# Patient Record
Sex: Female | Born: 2000 | Race: Black or African American | Hispanic: No | Marital: Single | Smoking: Former smoker
Health system: Southern US, Community
[De-identification: ages and names within clinical notes are randomized; demographics above are authoritative.]

## PROBLEM LIST (undated history)

## (undated) ENCOUNTER — Emergency Department (HOSPITAL_COMMUNITY): Admission: EM | Payer: Medicaid Other | Source: Home / Self Care

## (undated) ENCOUNTER — Inpatient Hospital Stay (HOSPITAL_COMMUNITY): Payer: Self-pay

## (undated) DIAGNOSIS — F419 Anxiety disorder, unspecified: Secondary | ICD-10-CM

## (undated) HISTORY — PX: OTHER SURGICAL HISTORY: SHX169

---
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
## 2021-05-02 ENCOUNTER — Emergency Department (HOSPITAL_COMMUNITY): Payer: Medicaid Other

## 2021-05-02 ENCOUNTER — Emergency Department (HOSPITAL_COMMUNITY)
Admission: EM | Admit: 2021-05-02 | Discharge: 2021-05-02 | Disposition: A | Discharge status: Home / Self Care | Payer: Medicaid Other | Attending: Emergency Medicine | Admitting: Emergency Medicine

## 2021-05-02 ENCOUNTER — Encounter (HOSPITAL_COMMUNITY): Payer: Self-pay

## 2021-05-02 DIAGNOSIS — O26891 Other specified pregnancy related conditions, first trimester: Secondary | ICD-10-CM | POA: Insufficient documentation

## 2021-05-02 DIAGNOSIS — R42 Dizziness and giddiness: Secondary | ICD-10-CM | POA: Insufficient documentation

## 2021-05-02 DIAGNOSIS — O4690 Antepartum hemorrhage, unspecified, unspecified trimester: Secondary | ICD-10-CM

## 2021-05-02 DIAGNOSIS — N9489 Other specified conditions associated with female genital organs and menstrual cycle: Secondary | ICD-10-CM | POA: Diagnosis not present

## 2021-05-02 DIAGNOSIS — Z3490 Encounter for supervision of normal pregnancy, unspecified, unspecified trimester: Secondary | ICD-10-CM

## 2021-05-02 DIAGNOSIS — N938 Other specified abnormal uterine and vaginal bleeding: Secondary | ICD-10-CM | POA: Diagnosis not present

## 2021-05-02 DIAGNOSIS — R1030 Lower abdominal pain, unspecified: Secondary | ICD-10-CM | POA: Insufficient documentation

## 2021-05-02 DIAGNOSIS — O209 Hemorrhage in early pregnancy, unspecified: Secondary | ICD-10-CM | POA: Insufficient documentation

## 2021-05-02 DIAGNOSIS — Z3A08 8 weeks gestation of pregnancy: Secondary | ICD-10-CM | POA: Diagnosis not present

## 2021-05-02 DIAGNOSIS — R110 Nausea: Secondary | ICD-10-CM | POA: Insufficient documentation

## 2021-05-02 DIAGNOSIS — F17200 Nicotine dependence, unspecified, uncomplicated: Secondary | ICD-10-CM | POA: Insufficient documentation

## 2021-05-02 DIAGNOSIS — O0001 Abdominal pregnancy with intrauterine pregnancy: Secondary | ICD-10-CM | POA: Diagnosis not present

## 2021-05-02 DIAGNOSIS — R197 Diarrhea, unspecified: Secondary | ICD-10-CM | POA: Insufficient documentation

## 2021-05-02 LAB — BASIC METABOLIC PANEL
Anion gap: 6 (ref 5–15)
BUN: 9 mg/dL (ref 6–20)
CO2: 27 mmol/L (ref 22–32)
Calcium: 9.1 mg/dL (ref 8.9–10.3)
Chloride: 102 mmol/L (ref 98–111)
Creatinine, Ser: 0.84 mg/dL (ref 0.44–1.00)
GFR, Estimated: >60 mL/min (ref >60)
Glucose, Bld: 63 mg/dL — ABNORMAL LOW (ref 70–99)
Potassium: 3.7 mmol/L (ref 3.5–5.1)
Sodium: 135 mmol/L (ref 135–145)

## 2021-05-02 LAB — WET PREP, GENITAL
Clue Cells Wet Prep HPF POC: NONE SEEN
Sperm: NONE SEEN
Trich, Wet Prep: NONE SEEN
WBC, Wet Prep HPF POC: <10 (ref <10)
Yeast Wet Prep HPF POC: NONE SEEN

## 2021-05-02 LAB — CBC WITH DIFFERENTIAL/PLATELET
Abs Immature Granulocytes: 0.03 10*3/uL (ref 0.00–0.07)
Basophils Absolute: 0.1 10*3/uL (ref 0.0–0.1)
Basophils Relative: 1 %
Eosinophils Absolute: 0.2 10*3/uL (ref 0.0–0.5)
Eosinophils Relative: 2 %
HCT: 38.4 % (ref 36.0–46.0)
Hemoglobin: 13.3 g/dL (ref 12.0–15.0)
Immature Granulocytes: 0 %
Lymphocytes Relative: 25 %
Lymphs Abs: 2.6 10*3/uL (ref 0.7–4.0)
MCH: 30.3 pg (ref 26.0–34.0)
MCHC: 34.6 g/dL (ref 30.0–36.0)
MCV: 87.5 fL (ref 80.0–100.0)
Monocytes Absolute: 0.8 10*3/uL (ref 0.1–1.0)
Monocytes Relative: 8 %
Neutro Abs: 6.8 10*3/uL (ref 1.7–7.7)
Neutrophils Relative %: 64 %
Platelets: 302 10*3/uL (ref 150–400)
RBC: 4.39 MIL/uL (ref 3.87–5.11)
RDW: 12.1 % (ref 11.5–15.5)
WBC: 10.5 10*3/uL (ref 4.0–10.5)
nRBC: 0 % (ref 0.0–0.2)

## 2021-05-02 LAB — I-STAT BETA HCG BLOOD, ED (MC, WL, AP ONLY): I-stat hCG, quantitative: >2000 m[IU]/mL — ABNORMAL HIGH (ref <5)

## 2021-05-02 LAB — HCG, QUANTITATIVE, PREGNANCY: hCG, Beta Chain, Quant, S: 25128 m[IU]/mL — ABNORMAL HIGH (ref <5)

## 2021-05-02 MED ORDER — PRENATAL VITAMINS 28-0.8 MG PO TABS: 1.0000 | ORAL_TABLET | Freq: Every day | ORAL | 0 refills | Status: AC

## 2021-05-02 MED ORDER — SODIUM CHLORIDE 0.9 % IV BOLUS
1000.0000 mL | Freq: Once | INTRAVENOUS | Status: AC
  Administered 2021-05-02: 1000 mL via INTRAVENOUS

## 2021-05-02 NOTE — ED Triage Notes (Addendum)
Pt states she had a positive pregnancy test at home about 4 days ago. Pt reports abdominal cramping and vaginal bleeding that started yesterday.   Est. LMP 10/5

## 2021-05-02 NOTE — ED Provider Notes (Signed)
Emergency Medicine Provider Triage Evaluation Note  Kathryn Fisher , a 20 y.o. female  was evaluated in triage.  Pt complains of abdominal pain, vaginal bleeding for the last two days. LMP 10/5. Home pregnancy test positive, no confirmatory ultrasound yet. Some lightheadedness, no syncope. No N/V. No clots noted in bleeding.  Review of Systems  Positive: As above Negative: As above  Physical Exam  There were no vitals taken for this visit. Gen:   Awake, no distress   Resp:  Normal effort  MSK:   Moves extremities without difficulty  Other:  Some TTP suprapubic region  Medical Decision Making  Medically screening exam initiated at 3:41 PM.  Appropriate orders placed.  Kathryn Fisher was informed that the remainder of the evaluation will be completed by another provider, this initial triage assessment does not replace that evaluation, and the importance of remaining in the ED until their evaluation is complete.  Obtained stat and quant hcg, confirmatory [place], abo/rh -- 1st trimester bleeding workup   Olene Floss, PA-C 05/02/21 1549    Derwood Kaplan, MD 05/02/21 1801

## 2021-05-02 NOTE — ED Provider Notes (Signed)
[facility]-EMERGENCY DEPT Provider Note   CSN: [id number] Arrival date & time: 05/02/21  1448     History Chief Complaint  Patient presents with   Abdominal Pain   Vaginal Bleeding   Possible Pregnancy    Kathryn Fisher is a 20 y.o. female.  Kathryn Fisher is a 20 y.o. female with a history of anxiety, otherwise healthy, who presents to the emergency department for evaluation of vaginal bleeding.  Patient reports she thinks she is currently pregnant.  Last menstrual cycle was 10/5.  She is had a home positive pregnancy test but has not had any confirmatory ultrasound or prenatal care thus far.  She reports yesterday she started having some spotting and light vaginal bleeding.  Bleeding has continued today but has remained light, she has not passed any clots.  She reports that she has had some cramping lower abdominal pain that is worse on the right side but is not constant.  She reports she has been nauseated but has not had any vomiting and has been able to eat and drink.  She reports feeling mildly lightheaded today but no syncope.  Patient states she has had a little bit of diarrhea.  No urinary symptoms.  Has not noted vaginal discharge.  No chest pain or shortness of breath.  Reports prior history of a miscarriage.  The history is provided by the patient, a relative and medical records.  Vaginal Bleeding Associated symptoms: abdominal pain and nausea   Associated symptoms: no dizziness, no dysuria, no fever and no vaginal discharge   Possible Pregnancy Associated symptoms include abdominal pain. Pertinent negatives include no chest pain and no shortness of breath.      Past Medical History:  Diagnosis Date   Anxiety     There are no problems to display for this patient.   History reviewed. No pertinent surgical history.   OB History     Gravida  1   Para      Term      Preterm      AB      Living         SAB      IAB      Ectopic       Multiple      Live Births              No family history on file.  Social History   Tobacco Use   Smoking status: Some Days   Smokeless tobacco: Never  Vaping Use   Vaping Use: Every day  Substance Use Topics   Alcohol use: Yes    Comment: Social   Drug use: Not Currently    Types: Marijuana    Home Medications Prior to Admission medications   Medication Sig Start Date End Date Taking? Authorizing Provider  ibuprofen (ADVIL,MOTRIN) 800 MG tablet Take 1 tablet (800 mg total) by mouth 3 (three) times daily. 07/13/18   Muthersbaugh, Dahlia Client, PA-C  methocarbamol (ROBAXIN) 500 MG tablet Take 1 tablet (500 mg total) by mouth 2 (two) times daily. 07/13/18   Muthersbaugh, Dahlia Client, PA-C    Allergies    Patient has no allergy information on record.  Review of Systems   Review of Systems  Constitutional:  Negative for chills and fever.  HENT: Negative.    Respiratory:  Negative for cough and shortness of breath.   Cardiovascular:  Negative for chest pain.  Gastrointestinal:  Positive for abdominal pain and nausea. Negative for vomiting.  Genitourinary:  Positive for vaginal bleeding. Negative for dysuria, frequency and vaginal discharge.  Musculoskeletal:  Negative for arthralgias and myalgias.  Skin:  Negative for color change and rash.  Neurological:  Positive for light-headedness. Negative for dizziness and syncope.  All other systems reviewed and are negative.  Physical Exam Updated Vital Signs BP 105/61   Pulse 77   Temp 98.2 F (36.8 C) (Oral)   Resp 16   Ht 5\' 5"  (1.651 m)   Wt 54.4 kg   LMP 03/04/2021   SpO2 100%   BMI 19.97 kg/m   Physical Exam Vitals and nursing note reviewed.  Constitutional:      General: She is not in acute distress.    Appearance: Normal appearance. She is well-developed and normal weight. She is not ill-appearing or diaphoretic.  HENT:     Head: Normocephalic and atraumatic.     Mouth/Throat:     Mouth: Mucous membranes are  moist.     Pharynx: Oropharynx is clear.  Eyes:     General:        Right eye: No discharge.        Left eye: No discharge.     Pupils: Pupils are equal, round, and reactive to light.  Cardiovascular:     Rate and Rhythm: Normal rate and regular rhythm.     Pulses: Normal pulses.     Heart sounds: Normal heart sounds.  Pulmonary:     Effort: Pulmonary effort is normal. No respiratory distress.     Breath sounds: Normal breath sounds. No wheezing or rales.     Comments: Respirations equal and unlabored, patient able to speak in full sentences, lungs clear to auscultation bilaterally  Abdominal:     General: Bowel sounds are normal. There is no distension.     Palpations: Abdomen is soft. There is no mass.     Tenderness: There is abdominal tenderness. There is no guarding.     Comments: Abdomen soft, nondistended, bowel sounds present throughout, there is generalized very mild tenderness across the low abdomen, slightly worse in the right lower quadrant.  Genitourinary:    Comments: Chaperone present during pelvic exam. No external genital lesions noted. On speculum exam there is no blood present in the vaginal vault, no clots or products of conception, cervical os is closed. On bimanual exam patient with no cervical motion tenderness and no uterine or adnexal tenderness. Musculoskeletal:        General: No deformity.     Cervical back: Neck supple.  Skin:    General: Skin is warm and dry.     Capillary Refill: Capillary refill takes less than 2 seconds.  Neurological:     Mental Status: She is alert and oriented to person, place, and time.     Coordination: Coordination normal.     Comments: Speech is clear, able to follow commands Moves extremities without ataxia, coordination intact  Psychiatric:        Mood and Affect: Mood normal.        Behavior: Behavior normal.    ED Results / Procedures / Treatments   Labs (all labs ordered are listed, but only abnormal results are  displayed) Labs Reviewed  BASIC METABOLIC PANEL - Abnormal; Notable for the following components:      Result Value   Glucose, Bld 63 (*)    All other components within normal limits  HCG, QUANTITATIVE, PREGNANCY - Abnormal; Notable for the following components:   hCG, Beta Chain, Quant,  S 25,128 (*)    All other components within normal limits  I-STAT BETA HCG BLOOD, ED ([facility], [facility], [facility] ONLY) - Abnormal; Notable for the following components:   I-stat hCG, quantitative >2,000.0 (*)    All other components within normal limits  WET PREP, GENITAL  CBC WITH DIFFERENTIAL/PLATELET  ABO/RH  TYPE AND SCREEN  GC/CHLAMYDIA PROBE AMP ([place]) NOT AT [facility]    EKG None  Radiology US OB Comp < 14 Wks  Result Date: 05/02/2021 CLINICAL DATA:  Pregnant, vaginal bleeding.  LMP 03/04/2021. EXAM: OBSTETRIC <14 WK [place] AND TRANSVAGINAL OB US TECHNIQUE: Both transabdominal and transvaginal ultrasound examinations were performed for complete evaluation of the gestation as well as the maternal uterus, adnexal regions, and pelvic cul-de-sac. Transvaginal technique was performed to assess early pregnancy. COMPARISON:  None. FINDINGS: Intrauterine gestational sac: Present, single Yolk sac:  Present, single, normal appearing Embryo:  Not visualized Cardiac Activity: Not applicable MSD: 13 mm   6 w   1 d CRL: Not applicable Subchorionic hemorrhage:  None visualized. Maternal uterus/adnexae: The uterus is anteverted. No intrauterine masses are seen. The cervix is unremarkable. The maternal ovaries are unremarkable save for a corpus luteum noted within the left ovary. No free fluid within the cul-de-sac. IMPRESSION: Intrauterine gestational sac identified. Visualization of the yolk sac but nonvisualization of a fetal pole roughly estimates the gestational between 5.5 and 6.0 weeks. Follow-up sonography is recommended in 10-14 days to document appropriate progression. No acute abnormality. Electronically Signed   By: Helyn Numbers M.D.   On: 05/02/2021 18:51   US OB Transvaginal  Result Date: 05/02/2021 CLINICAL DATA:  Pregnant, vaginal bleeding.  LMP 03/04/2021. EXAM: OBSTETRIC <14 WK [place] AND TRANSVAGINAL OB US TECHNIQUE: Both transabdominal and transvaginal ultrasound examinations were performed for complete evaluation of the gestation as well as the maternal uterus, adnexal regions, and pelvic cul-de-sac. Transvaginal technique was performed to assess early pregnancy. COMPARISON:  None. FINDINGS: Intrauterine gestational sac: Present, single Yolk sac:  Present, single, normal appearing Embryo:  Not visualized Cardiac Activity: Not applicable MSD: 13 mm   6 w   1 d CRL: Not applicable Subchorionic hemorrhage:  None visualized. Maternal uterus/adnexae: The uterus is anteverted. No intrauterine masses are seen. The cervix is unremarkable. The maternal ovaries are unremarkable save for a corpus luteum noted within the left ovary. No free fluid within the cul-de-sac. IMPRESSION: Intrauterine gestational sac identified. Visualization of the yolk sac but nonvisualization of a fetal pole roughly estimates the gestational between 5.5 and 6.0 weeks. Follow-up sonography is recommended in 10-14 days to document appropriate progression. No acute abnormality. Electronically Signed   By: Helyn Numbers M.D.   On: 05/02/2021 18:51    Procedures Procedures   Medications Ordered in ED Medications  sodium chloride 0.9 % bolus 1,000 mL (1,000 mLs Intravenous New Bag/Given 05/02/21 1648)    ED Course  I have reviewed the triage vital signs and the nursing notes.  Pertinent labs & imaging results that were available during my care of the patient were reviewed by me and considered in my medical decision making (see chart for details).    MDM Rules/Calculators/A&P                           20 year old female presents to the emergency department for vaginal bleeding and lower abdominal cramping in early pregnancy.  Based on LMP  patient is approximately [redacted] weeks pregnant, she has not had  any confirmatory ultrasound or prenatal care thus far.  On arrival patient has slightly soft blood pressures but at baseline her blood pressures appear to be low normal, otherwise she is hemodynamically stable, well-appearing and in no acute distress.  She does not have any active bleeding on exam and has mild lower abdominal tenderness.  Will initiate work-up to rule out ectopic pregnancy, differential also includes miscarriage or threatened abortion, versus spotting in early pregnancy.  I am reassured that she does not have any current active bleeding.  I have independently ordered, reviewed and interpreted all labs and imaging:  CBC: No leukocytosis and normal hemoglobin despite reported bleeding BMP:  Glucose of 63, given food and drink, no other electrolyte derangements, normal renal function Beta-hCG: 25,000 128  Wet prep is unremarkable, GC/chlamydia pending  Ultrasound shows an intrauterine gestational sac, yolk sac is visualized but there is nonvisualization of fetal pole, which estimates gestation is between 5.5 and 6 weeks.  Follow-up ultrasound in 10 to 14 days is recommended.  We will also have patient follow-up for repeat hCG in 48 hours.  I discussed these results with patient and family at bedside.  Started patient on prenatal vitamins we will have her follow-up with the MAU, she has plans to schedule an initial OB/GYN appointment with a provider in [place].  Discussed strict return precautions.  Patient and family expressed understanding and agreement.  Discharged home in good condition.   Final Clinical Impression(s) / ED Diagnoses Final diagnoses:  Vaginal bleeding in pregnancy  Intrauterine pregnancy    Rx / DC Orders ED Discharge Orders          Ordered    Prenatal Vit-Fe Fumarate-FA (PRENATAL VITAMINS) 28-0.8 MG TABS  Daily        05/02/21 1950             Dartha Lodge, [place] 05/04/21 1308     Ernie Avena, MD 05/04/21 1347

## 2021-05-02 NOTE — Discharge Instructions (Addendum)
You need to follow-up in 2 days for recheck of your hCG at the maternity admissions unit at [facility], go to the [facility] entrance, not the emergency department.  If you have increased pain or bleeding before then you can go there sooner for recheck.  Please call to schedule follow-up appointment with OB/GYN.

## 2021-05-04 ENCOUNTER — Encounter (HOSPITAL_COMMUNITY): Payer: Self-pay | Admitting: Student

## 2021-05-04 LAB — GC/CHLAMYDIA PROBE AMP (~~LOC~~) NOT AT ARMC
Chlamydia: NEGATIVE
Comment: NEGATIVE
Comment: NORMAL
Neisseria Gonorrhea: NEGATIVE

## 2021-08-02 ENCOUNTER — Encounter (HOSPITAL_COMMUNITY): Payer: Self-pay

## 2021-08-02 ENCOUNTER — Other Ambulatory Visit: Payer: Self-pay

## 2021-08-02 ENCOUNTER — Emergency Department (HOSPITAL_COMMUNITY)
Admission: EM | Admit: 2021-08-02 | Discharge: 2021-08-02 | Disposition: A | Discharge status: Home / Self Care | Payer: Medicaid Other | Attending: Emergency Medicine | Admitting: Emergency Medicine

## 2021-08-02 DIAGNOSIS — O9A212 Injury, poisoning and certain other consequences of external causes complicating pregnancy, second trimester: Secondary | ICD-10-CM | POA: Insufficient documentation

## 2021-08-02 DIAGNOSIS — W109XXA Fall (on) (from) unspecified stairs and steps, initial encounter: Secondary | ICD-10-CM | POA: Diagnosis not present

## 2021-08-02 DIAGNOSIS — S301XXA Contusion of abdominal wall, initial encounter: Secondary | ICD-10-CM | POA: Diagnosis not present

## 2021-08-02 DIAGNOSIS — O26892 Other specified pregnancy related conditions, second trimester: Secondary | ICD-10-CM | POA: Diagnosis present

## 2021-08-02 DIAGNOSIS — Z3A16 16 weeks gestation of pregnancy: Secondary | ICD-10-CM | POA: Insufficient documentation

## 2021-08-02 DIAGNOSIS — W19XXXA Unspecified fall, initial encounter: Secondary | ICD-10-CM

## 2021-08-02 MED ORDER — ACETAMINOPHEN 325 MG PO TABS
650.0000 mg | ORAL_TABLET | Freq: Once | ORAL | Status: AC
Start: 2021-08-02 — End: 2021-08-02
  Administered 2021-08-02: 650 mg via ORAL
  Filled 2021-08-02: qty 2

## 2021-08-02 NOTE — ED Notes (Signed)
Fetal Heart Tones assessed. Fetal HR 150 ?

## 2021-08-02 NOTE — ED Provider Notes (Signed)
?[place] DEPT ?Provider Note ? ? ?CSN: [id number] ?Arrival date & time: 08/02/21  2012 ? ?  ? ?History ? ?Chief Complaint  ?Patient presents with  ? Fall  ? ? ?Kathryn Fisher is a 21 y.o. female. ? ?HPI ?She is here for evaluation of injury from fall.  She was going down some steps and fell about 4 steps, landing at the bottom.  She was able to get up on her own afterwards and then noticed pain on the abdomen.  She denies headache, neck pain, back pain, vaginal bleeding, nausea, vomiting, weakness or dizziness. ?  ? ?Home Medications ?Prior to Admission medications   ?Medication Sig Start Date End Date Taking? Authorizing Provider  ?ibuprofen (ADVIL,MOTRIN) 800 MG tablet Take 1 tablet (800 mg total) by mouth 3 (three) times daily. 07/13/18   Muthersbaugh, Jarrett Soho, PA-C  ?methocarbamol (ROBAXIN) 500 MG tablet Take 1 tablet (500 mg total) by mouth 2 (two) times daily. 07/13/18   Muthersbaugh, Jarrett Soho, PA-C  ?Prenatal Vit-Fe Fumarate-FA (PRENATAL VITAMINS) 28-0.8 MG TABS Take 1 tablet by mouth daily. 05/02/21   Jacqlyn Larsen, PA-C  ?   ? ?Allergies    ?Patient has no allergy information on record.   ? ?Review of Systems   ?Review of Systems ? ?Physical Exam ?Updated Vital Signs ?BP (!) 98/56   Pulse 86   Temp 97.9 ?F (36.6 ?C) (Oral)   Resp 16   Ht 5\' 5"  (1.651 m)   Wt 54.4 kg   LMP 03/04/2021   SpO2 100%   BMI 19.97 kg/m?  ?Physical Exam ?Vitals and nursing note reviewed.  ?Constitutional:   ?   General: She is not in acute distress. ?   Appearance: Normal appearance. She is well-developed and normal weight. She is not ill-appearing, toxic-appearing or diaphoretic.  ?HENT:  ?   Head: Normocephalic and atraumatic.  ?Eyes:  ?   Conjunctiva/sclera: Conjunctivae normal.  ?   Pupils: Pupils are equal, round, and reactive to light.  ?Neck:  ?   Trachea: Phonation normal.  ?Cardiovascular:  ?   Rate and Rhythm: Normal rate and regular rhythm.  ?Pulmonary:  ?   Effort: Pulmonary effort is  normal.  ?   Breath sounds: Normal breath sounds.  ?Chest:  ?   Chest wall: No tenderness.  ?Abdominal:  ?   General: There is no distension.  ?   Palpations: Abdomen is soft.  ?   Tenderness: There is no abdominal tenderness. There is no guarding.  ?   Comments: No areas of bruising or deformity.  Uterus is not palpable.  ?Musculoskeletal:     ?   General: Normal range of motion.  ?   Cervical back: Normal range of motion and neck supple.  ?Skin: ?   General: Skin is warm and dry.  ?Neurological:  ?   Mental Status: She is alert and oriented to person, place, and time.  ?   Motor: No abnormal muscle tone.  ?Psychiatric:     ?   Mood and Affect: Mood normal.     ?   Behavior: Behavior normal.     ?   Thought Content: Thought content normal.     ?   Judgment: Judgment normal.  ? ? ?ED Results / Procedures / Treatments   ?Labs ?(all labs ordered are listed, but only abnormal results are displayed) ?Labs Reviewed - No data to display ? ?EKG ?None ? ?Radiology ?No results found. ? ?Procedures ?Procedures  ? ? ?  Medications Ordered in ED ?Medications  ?acetaminophen (TYLENOL) tablet 650 mg (650 mg Oral Given 08/02/21 2200)  ? ? ?ED Course/ Medical Decision Making/ A&P ?  ?                        ?Medical Decision Making ?Patient presenting for abdominal pain after a fall.  This is an isolated injury.  She is [redacted] weeks pregnant with due date 12/06/2021. ? ?Amount and/or Complexity of Data Reviewed ?Independent Historian:  ?   Details: She is a cogent historian ?Discussion of management or test interpretation with external provider(s): Case discussed with Dr. Rip Harbour, obstetrician on-call.  He recommends follow-up as needed and usual obstetric care ? ?Risk ?OTC drugs. ?Risk Details: Patient presenting with concern for fetal injury from fall.  Exam is reassuring.  Fetal heart tones are 150.  Vital signs are normal.  She is not having any vaginal bleeding.  She has close term follow-up scheduled for tomorrow for routine obstetric  care.  She is instructed on what to watch for.  Since she is not actively bleeding, and fetal heart tones are normal, I do not suspect there is any intrauterine or fetal injury.  She does not require hospitalization or further observation in the ED at this time. ? ? ? ? ? ? ? ? ? ? ?Final Clinical Impression(s) / ED Diagnoses ?Final diagnoses:  ?Fall, initial encounter  ?Contusion of abdominal wall, initial encounter  ?[redacted] weeks gestation of pregnancy  ? ? ?Rx / DC Orders ?ED Discharge Orders   ? ? None  ? ?  ? ? ?  ?Daleen Bo, MD ?08/02/21 2219 ? ?

## 2021-08-02 NOTE — Discharge Instructions (Addendum)
Get plenty of rest and drink a lot of fluids.  Use Tylenol if needed for pain.  If you begin to have more discomfort, or develop vaginal bleeding, go to the maternity admissions unit at [facility].  Follow-up with your obstetrician tomorrow, as scheduled. ?

## 2021-08-02 NOTE — ED Triage Notes (Signed)
Pt reports falling today while wearing heels. Pt is 4 months pregnant and states that she hit the left side of her abdomen. Pt does not feel movement as of yet with her pregnancy.  ?

## 2021-08-02 NOTE — ED Notes (Signed)
Patient ambulatory to restroom  ?

## 2021-11-29 ENCOUNTER — Observation Stay (HOSPITAL_COMMUNITY)
Admission: AD | Admit: 2021-11-29 | Discharge: 2021-11-30 | Disposition: A | Discharge status: Home / Self Care | Payer: Medicaid Other | Attending: Obstetrics & Gynecology | Admitting: Obstetrics & Gynecology

## 2021-11-29 DIAGNOSIS — O4703 False labor before 37 completed weeks of gestation, third trimester: Secondary | ICD-10-CM | POA: Diagnosis not present

## 2021-11-29 DIAGNOSIS — W19XXXA Unspecified fall, initial encounter: Secondary | ICD-10-CM

## 2021-11-29 DIAGNOSIS — F17200 Nicotine dependence, unspecified, uncomplicated: Secondary | ICD-10-CM | POA: Diagnosis not present

## 2021-11-29 DIAGNOSIS — T1490XA Injury, unspecified, initial encounter: Secondary | ICD-10-CM | POA: Diagnosis not present

## 2021-11-29 DIAGNOSIS — Z79899 Other long term (current) drug therapy: Secondary | ICD-10-CM | POA: Diagnosis not present

## 2021-11-29 DIAGNOSIS — W03XXXA Other fall on same level due to collision with another person, initial encounter: Secondary | ICD-10-CM | POA: Diagnosis not present

## 2021-11-29 DIAGNOSIS — Z3A36 36 weeks gestation of pregnancy: Secondary | ICD-10-CM | POA: Diagnosis not present

## 2021-11-29 DIAGNOSIS — O99333 Smoking (tobacco) complicating pregnancy, third trimester: Secondary | ICD-10-CM | POA: Diagnosis not present

## 2021-11-29 DIAGNOSIS — O9A213 Injury, poisoning and certain other consequences of external causes complicating pregnancy, third trimester: Principal | ICD-10-CM | POA: Insufficient documentation

## 2021-11-29 DIAGNOSIS — O9A219 Injury, poisoning and certain other consequences of external causes complicating pregnancy, unspecified trimester: Secondary | ICD-10-CM | POA: Diagnosis present

## 2021-11-29 LAB — CBC
HCT: 32.7 % — ABNORMAL LOW (ref 36.0–46.0)
Hemoglobin: 11.3 g/dL — ABNORMAL LOW (ref 12.0–15.0)
MCH: 30.1 pg (ref 26.0–34.0)
MCHC: 34.6 g/dL (ref 30.0–36.0)
MCV: 87 fL (ref 80.0–100.0)
Platelets: 261 10*3/uL (ref 150–400)
RBC: 3.76 MIL/uL — ABNORMAL LOW (ref 3.87–5.11)
RDW: 12.4 % (ref 11.5–15.5)
WBC: 13.3 10*3/uL — ABNORMAL HIGH (ref 4.0–10.5)
nRBC: 0 % (ref 0.0–0.2)

## 2021-11-29 LAB — TYPE AND SCREEN
ABO/RH(D): A POS
Antibody Screen: NEGATIVE

## 2021-11-29 MED ORDER — PRENATAL MULTIVITAMIN CH: 1.0000 | ORAL_TABLET | Freq: Every day | ORAL | Status: DC

## 2021-11-29 MED ORDER — NIFEDIPINE 10 MG PO CAPS
30.0000 mg | ORAL_CAPSULE | Freq: Once | ORAL | Status: AC
  Administered 2021-11-29: 30 mg via ORAL
  Filled 2021-11-29: qty 3

## 2021-11-29 MED ORDER — NIFEDIPINE 10 MG PO CAPS
10.0000 mg | ORAL_CAPSULE | Freq: Four times a day (QID) | ORAL | Status: DC
  Filled 2021-11-29: qty 1

## 2021-11-29 MED ORDER — ACETAMINOPHEN 325 MG PO TABS: 650.0000 mg | ORAL_TABLET | ORAL | Status: DC | PRN

## 2021-11-29 MED ORDER — DOCUSATE SODIUM 100 MG PO CAPS
100.0000 mg | ORAL_CAPSULE | Freq: Every day | ORAL | Status: DC
  Administered 2021-11-29: 100 mg via ORAL
  Filled 2021-11-29: qty 1

## 2021-11-29 MED ORDER — LACTATED RINGERS IV SOLN
INTRAVENOUS | Status: DC
Start: 2021-11-29 — End: 2021-11-30

## 2021-11-29 MED ORDER — CALCIUM CARBONATE ANTACID 500 MG PO CHEW: 2.0000 | CHEWABLE_TABLET | ORAL | Status: DC | PRN
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
## 2021-11-30 ENCOUNTER — Observation Stay (HOSPITAL_BASED_OUTPATIENT_CLINIC_OR_DEPARTMENT_OTHER): Payer: Medicaid Other

## 2021-11-30 DIAGNOSIS — O4703 False labor before 37 completed weeks of gestation, third trimester: Secondary | ICD-10-CM

## 2021-11-30 DIAGNOSIS — Z3A36 36 weeks gestation of pregnancy: Secondary | ICD-10-CM | POA: Diagnosis not present

## 2021-11-30 DIAGNOSIS — O99891 Other specified diseases and conditions complicating pregnancy: Secondary | ICD-10-CM | POA: Diagnosis not present

## 2021-11-30 DIAGNOSIS — W03XXXA Other fall on same level due to collision with another person, initial encounter: Secondary | ICD-10-CM

## 2021-11-30 DIAGNOSIS — O9A213 Injury, poisoning and certain other consequences of external causes complicating pregnancy, third trimester: Secondary | ICD-10-CM

## 2021-11-30 DIAGNOSIS — R1084 Generalized abdominal pain: Secondary | ICD-10-CM

## 2021-11-30 LAB — AMNISURE RUPTURE OF MEMBRANE (ROM) NOT AT ARMC: Amnisure ROM: NEGATIVE

## 2021-11-30 MED ORDER — NIFEDIPINE 10 MG PO CAPS: 10.0000 mg | ORAL_CAPSULE | Freq: Four times a day (QID) | ORAL | Status: DC | PRN

## 2021-11-30 MED ORDER — CYCLOBENZAPRINE HCL 10 MG PO TABS: 10.0000 mg | ORAL_TABLET | Freq: Three times a day (TID) | ORAL | 2 refills | Status: AC | PRN

## 2021-11-30 NOTE — Progress Notes (Signed)
Discharge instructions and prescriptions given to pt. Discussed signs and symptoms to report to the MD, upcoming appointments, and meds. Pt verbalizes understanding and has no questions or concerns at this time. Pt discharged home from hospital in stable condition. 
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
## 2021-11-30 NOTE — Progress Notes (Signed)
Amnisure is negative, contractions no longer felt by patient. Reassuring FHT.  Will reevaluate in a few hours. If no signs of labor or other concerns, will discharge to home.   Jaynie Collins, MD, FACOG Obstetrician & Gynecologist, [facility] for [employer], [facility]

## 2022-05-07 ENCOUNTER — Other Ambulatory Visit: Payer: Self-pay

## 2022-05-07 ENCOUNTER — Encounter (HOSPITAL_COMMUNITY): Payer: Self-pay

## 2022-05-07 ENCOUNTER — Emergency Department (HOSPITAL_COMMUNITY)
Admission: EM | Admit: 2022-05-07 | Discharge: 2022-05-07 | Discharge status: Against Medical Advice | Payer: Medicaid Other | Attending: Emergency Medicine | Admitting: Emergency Medicine

## 2022-05-07 DIAGNOSIS — R110 Nausea: Secondary | ICD-10-CM | POA: Insufficient documentation

## 2022-05-07 DIAGNOSIS — O26851 Spotting complicating pregnancy, first trimester: Secondary | ICD-10-CM | POA: Diagnosis present

## 2022-05-07 DIAGNOSIS — R1030 Lower abdominal pain, unspecified: Secondary | ICD-10-CM | POA: Insufficient documentation

## 2022-05-07 DIAGNOSIS — Z5321 Procedure and treatment not carried out due to patient leaving prior to being seen by health care provider: Secondary | ICD-10-CM | POA: Insufficient documentation

## 2022-05-07 DIAGNOSIS — M545 Low back pain, unspecified: Secondary | ICD-10-CM | POA: Diagnosis not present

## 2022-05-07 LAB — CBC WITH DIFFERENTIAL/PLATELET
Abs Immature Granulocytes: 0.03 10*3/uL (ref 0.00–0.07)
Basophils Absolute: 0 10*3/uL (ref 0.0–0.1)
Basophils Relative: 0 %
Eosinophils Absolute: 0.1 10*3/uL (ref 0.0–0.5)
Eosinophils Relative: 0 %
HCT: 38.6 % (ref 36.0–46.0)
Hemoglobin: 13 g/dL (ref 12.0–15.0)
Immature Granulocytes: 0 %
Lymphocytes Relative: 22 %
Lymphs Abs: 2.4 10*3/uL (ref 0.7–4.0)
MCH: 28.8 pg (ref 26.0–34.0)
MCHC: 33.7 g/dL (ref 30.0–36.0)
MCV: 85.4 fL (ref 80.0–100.0)
Monocytes Absolute: 0.8 10*3/uL (ref 0.1–1.0)
Monocytes Relative: 7 %
Neutro Abs: 7.9 10*3/uL — ABNORMAL HIGH (ref 1.7–7.7)
Neutrophils Relative %: 71 %
Platelets: 304 10*3/uL (ref 150–400)
RBC: 4.52 MIL/uL (ref 3.87–5.11)
RDW: 13.3 % (ref 11.5–15.5)
WBC: 11.3 10*3/uL — ABNORMAL HIGH (ref 4.0–10.5)
nRBC: 0 % (ref 0.0–0.2)

## 2022-05-07 LAB — PREGNANCY, URINE: Preg Test, Ur: NEGATIVE

## 2022-05-07 NOTE — ED Notes (Signed)
Pt states that she is going to [place] at [place].

## 2022-05-07 NOTE — ED Triage Notes (Signed)
Pt states that she has been having red spotting and lower back pain along with abdominal cramping since today. Pt reports being [redacted] weeks pregnant.

## 2022-05-07 NOTE — ED Provider Triage Note (Signed)
Emergency Medicine Provider Triage Evaluation Note  Kathryn Fisher is a 21 y.o. female, G2P1011, who presents to the emergency department with complaints of vaginal bleeding. She has had spotting x 1 week. C/o associated sharp suprapubic abdominal pain that is intermittent as well as pain in the low back. Notes nausea w/o vomiting as well as dysuria. No fevers. She reports to be [redacted] weeks pregnant.    Review of Systems  Positive: As above Negative: As above  Physical Exam  BP 101/72 (BP Location: Right Arm)   Pulse 88   Temp 98.7 F (37.1 C) (Oral)   Resp 20   Ht 5\' 5"  (1.651 m)   Wt 74.8 kg   SpO2 100%   BMI 27.46 kg/m  Gen:   Awake, no distress   Resp:  Normal effort  MSK:   Moves extremities without difficulty  Other:  Abdomen nondistended.  Medical Decision Making  Medically screening exam initiated at 11:13 PM.  Appropriate orders placed.  Kathryn Fisher was informed that the remainder of the evaluation will be completed by another provider, this initial triage assessment does not replace that evaluation, and the importance of remaining in the ED until their evaluation is complete.  Suprapubic abdominal pain, vaginal spotting - pending Upreg and labs.   Christie Beckers, PA-C 05/07/22 2314

## 2022-05-08 LAB — BASIC METABOLIC PANEL
Anion gap: 8 (ref 5–15)
BUN: 7 mg/dL (ref 6–20)
CO2: 22 mmol/L (ref 22–32)
Calcium: 9.2 mg/dL (ref 8.9–10.3)
Chloride: 105 mmol/L (ref 98–111)
Creatinine, Ser: <0.3 mg/dL — ABNORMAL LOW (ref 0.44–1.00)
Glucose, Bld: 82 mg/dL (ref 70–99)
Potassium: 3.6 mmol/L (ref 3.5–5.1)
Sodium: 135 mmol/L (ref 135–145)

## 2022-05-08 LAB — ABO/RH: ABO/RH(D): A POS

## 2022-05-08 LAB — HCG, QUANTITATIVE, PREGNANCY: hCG, Beta Chain, Quant, S: 102437 m[IU]/mL — ABNORMAL HIGH (ref <5)

## 2024-01-28 ENCOUNTER — Other Ambulatory Visit: Payer: Self-pay

## 2024-01-28 ENCOUNTER — Encounter (HOSPITAL_COMMUNITY): Payer: Self-pay | Admitting: Obstetrics & Gynecology

## 2024-01-28 ENCOUNTER — Inpatient Hospital Stay (HOSPITAL_COMMUNITY)
Admission: AD | Admit: 2024-01-28 | Discharge: 2024-01-28 | Disposition: A | Discharge status: Home / Self Care | Attending: Obstetrics & Gynecology | Admitting: Obstetrics & Gynecology

## 2024-01-28 DIAGNOSIS — Z3A36 36 weeks gestation of pregnancy: Secondary | ICD-10-CM

## 2024-01-28 DIAGNOSIS — E876 Hypokalemia: Secondary | ICD-10-CM

## 2024-01-28 DIAGNOSIS — O99283 Endocrine, nutritional and metabolic diseases complicating pregnancy, third trimester: Secondary | ICD-10-CM | POA: Diagnosis not present

## 2024-01-28 DIAGNOSIS — O99013 Anemia complicating pregnancy, third trimester: Secondary | ICD-10-CM

## 2024-01-28 DIAGNOSIS — O26893 Other specified pregnancy related conditions, third trimester: Secondary | ICD-10-CM | POA: Diagnosis present

## 2024-01-28 DIAGNOSIS — R55 Syncope and collapse: Secondary | ICD-10-CM

## 2024-01-28 DIAGNOSIS — O99019 Anemia complicating pregnancy, unspecified trimester: Secondary | ICD-10-CM

## 2024-01-28 DIAGNOSIS — O09213 Supervision of pregnancy with history of pre-term labor, third trimester: Secondary | ICD-10-CM | POA: Diagnosis not present

## 2024-01-28 LAB — BRAIN NATRIURETIC PEPTIDE: B Natriuretic Peptide: 28 pg/mL (ref 0.0–100.0)

## 2024-01-28 LAB — COMPREHENSIVE METABOLIC PANEL WITH GFR
ALT: 13 U/L (ref 0–44)
AST: 25 U/L (ref 15–41)
Albumin: 2.5 g/dL — ABNORMAL LOW (ref 3.5–5.0)
Alkaline Phosphatase: 115 U/L (ref 38–126)
Anion gap: 13 (ref 5–15)
BUN: <5 mg/dL — ABNORMAL LOW (ref 6–20)
CO2: 20 mmol/L — ABNORMAL LOW (ref 22–32)
Calcium: 8.6 mg/dL — ABNORMAL LOW (ref 8.9–10.3)
Chloride: 104 mmol/L (ref 98–111)
Creatinine, Ser: 0.77 mg/dL (ref 0.44–1.00)
GFR, Estimated: >60 mL/min (ref >60)
Glucose, Bld: 100 mg/dL — ABNORMAL HIGH (ref 70–99)
Potassium: 3 mmol/L — ABNORMAL LOW (ref 3.5–5.1)
Sodium: 137 mmol/L (ref 135–145)
Total Bilirubin: 0.5 mg/dL (ref 0.0–1.2)
Total Protein: 6.1 g/dL — ABNORMAL LOW (ref 6.5–8.1)

## 2024-01-28 LAB — URINALYSIS, ROUTINE W REFLEX MICROSCOPIC
Bilirubin Urine: NEGATIVE
Glucose, UA: NEGATIVE mg/dL
Ketones, ur: NEGATIVE mg/dL
Nitrite: NEGATIVE
Protein, ur: NEGATIVE mg/dL
Specific Gravity, Urine: 1.008 (ref 1.005–1.030)
pH: 6 (ref 5.0–8.0)

## 2024-01-28 LAB — CBC
HCT: 29.7 % — ABNORMAL LOW (ref 36.0–46.0)
Hemoglobin: 10 g/dL — ABNORMAL LOW (ref 12.0–15.0)
MCH: 28.8 pg (ref 26.0–34.0)
MCHC: 33.7 g/dL (ref 30.0–36.0)
MCV: 85.6 fL (ref 80.0–100.0)
Platelets: 272 K/uL (ref 150–400)
RBC: 3.47 MIL/uL — ABNORMAL LOW (ref 3.87–5.11)
RDW: 13.2 % (ref 11.5–15.5)
WBC: 8.5 K/uL (ref 4.0–10.5)
nRBC: 0 % (ref 0.0–0.2)

## 2024-01-28 LAB — GLUCOSE, CAPILLARY: Glucose-Capillary: 112 mg/dL — ABNORMAL HIGH (ref 70–99)

## 2024-01-28 MED ORDER — POTASSIUM CHLORIDE CRYS ER 20 MEQ PO TBCR
40.0000 meq | EXTENDED_RELEASE_TABLET | Freq: Once | ORAL | Status: AC
  Administered 2024-01-28: 40 meq via ORAL
  Filled 2024-01-28: qty 2

## 2024-01-28 MED ORDER — INFUVITE ADULT IV SOLN
Freq: Once | INTRAVENOUS | Status: AC
  Filled 2024-01-28 (×2): qty 10

## 2024-01-28 MED ORDER — FERRIC MALTOL 30 MG PO CAPS
1.0000 | ORAL_CAPSULE | Freq: Two times a day (BID) | ORAL | 2 refills | Status: AC
Start: 2024-01-28 — End: ?

## 2024-01-28 MED ORDER — POTASSIUM CHLORIDE CRYS ER 20 MEQ PO TBCR: 20.0000 meq | EXTENDED_RELEASE_TABLET | Freq: Two times a day (BID) | ORAL | 0 refills | Status: AC

## 2024-01-28 MED ORDER — LACTATED RINGERS IV BOLUS
1000.0000 mL | Freq: Once | INTRAVENOUS | Status: AC
  Administered 2024-01-28: 1000 mL via INTRAVENOUS

## 2024-01-28 NOTE — MAU Note (Signed)
 Kathryn Fisher is a 23 y.o. at Unknown here in MAU reporting: ongoing ctxs for 2 days. She has not been timing them. Denies any VB. Possible LOF, clear fluid, enough to make her undies wet. Reports +FM. Lower back pain that comes and goes. Reports feeling light headed and dizziness that started around 1100. Reports eating and drinking today. Feels like she is going to pass out. Dilated 1.5cm when she was 32-33 weeks.   Onset of complaint: ongoing Pain score: 10 Lab orders placed from triage:

## 2024-01-28 NOTE — MAU Provider Note (Signed)
 Chief Complaint:  Contractions   HPI    Alejandra Hunt is a 23 y.o. [id number] at [redacted]w[redacted]d who presents to maternity admissions reporting she is 36 weeks 1 day reporting that she was discharged earlier today and started feeling extremely hot, clammy, and states she felt like she was going to pass out  patient also reports she was having some contractions but denies any leaking of fluid, vaginal bleeding, and reports good fetal movements.  On presentation in the MAU she was found to be hypotensive and tachycardic also complaining of dizziness.  Prenatal care is being received at [facility] and she was last seen on 01/23/2024.  Her pregnancy is complicated by a prior preterm delivery at 34 weeks , anemia, at risk for hypertension, history of PPROM, short interval between pregnancies and patient had expressed a desire at her last prenatal appointment (01/23/24) for an induction of labor which will be deferred until after 39 weeks unless medically indicated.  This information was obtained from Care Everywhere  Pregnancy Course: [facility] (available prenatal records in Care Everywhere reviewed)  Past Medical History:  Diagnosis Date   Anxiety    OB History  Gravida Para Term Preterm AB Living  3 2 0 2 0 2  SAB IAB Ectopic Multiple Live Births  0 0 0 0 2    # Outcome Date GA Lbr Len/2nd Weight Sex Type Anes PTL Lv  3 Current           2 Preterm 11/19/22 [redacted]w[redacted]d   F Vag-Spont   LIV  1 Preterm 12/12/21 [redacted]w[redacted]d   F Vag-Spont   LIV   Past Surgical History:  Procedure Laterality Date   esophogeal N/A    History reviewed. No pertinent family history. Social History   Tobacco Use   Smoking status: Former    Types: Cigarettes   Smokeless tobacco: Never  Vaping Use   Vaping status: Every Day  Substance Use Topics   Alcohol use: Not Currently    Comment: Social   Drug use: Not Currently    Types: Marijuana   No Known Allergies Medications Prior to Admission  Medication Sig Dispense Refill  Last Dose/Taking   cyclobenzaprine  (FLEXERIL ) 10 MG tablet Take 1 tablet (10 mg total) by mouth 3 (three) times daily as needed for muscle spasms. 30 tablet 2 Past Week   Multiple Vitamin (MULTIVITAMIN) LIQD Take 5 mLs by mouth daily.   Past Week   Prenatal Vit-Fe Fumarate-FA (PRENATAL VITAMINS) 28-0.8 MG TABS Take 1 tablet by mouth daily. 30 tablet 0     I have reviewed patient's Past Medical Hx, Surgical Hx, Family Hx, Social Hx, medications and allergies.   ROS  Pertinent items noted in HPI and remainder of comprehensive ROS otherwise negative.   PHYSICAL EXAM  Patient Vitals for the past 24 hrs:  BP Temp Temp src Pulse Resp SpO2 Height Weight  01/28/24 1630 -- -- -- -- -- 100 % -- --  01/28/24 1625 -- -- -- -- -- 100 % -- --  01/28/24 1620 -- -- -- -- -- 100 % -- --  01/28/24 1615 -- -- -- -- -- 100 % -- --  01/28/24 1610 -- -- -- -- -- 100 % -- --  01/28/24 1555 -- -- -- -- -- 99 % -- --  01/28/24 1550 -- -- -- -- -- 99 % -- --  01/28/24 1545 97/69 -- -- (!) 113 -- 99 % -- --  01/28/24 1540 -- -- -- -- --  98 % -- --  01/28/24 1535 -- -- -- -- -- 99 % -- --  01/28/24 1530 (!) 92/58 -- -- (!) 107 -- 99 % -- --  01/28/24 1525 -- -- -- -- -- 99 % -- --  01/28/24 1520 -- -- -- -- -- 100 % -- --  01/28/24 1515 99/60 -- -- (!) 106 -- 100 % -- --  01/28/24 1510 -- -- -- -- -- 100 % -- --  01/28/24 1505 -- -- -- -- -- 93 % -- --  01/28/24 1500 (!) 106/59 -- -- (!) 109 -- 92 % -- --  01/28/24 1455 -- -- -- -- -- 92 % -- --  01/28/24 1445 (!) 95/55 -- -- (!) 115 -- 100 % -- --  01/28/24 1440 -- -- -- -- -- 100 % -- --  01/28/24 1436 (!) 106/59 -- -- (!) 121 -- -- -- --  01/28/24 1430 (!) 93/50 -- -- (!) 142 -- -- -- --  01/28/24 1427 (!) 88/62 97.8 F (36.6 C) Oral (!) 132 16 98 % 5' 5 (1.651 m) 77.5 kg    Constitutional: Well-developed, well-nourished female in no acute distress.  Cardiovascular: tachycardia and hypotensive  Respiratory: normal effort, no problems with  respiration noted, Lungs BCTA, O2 Sat @98 % room air GI: Abd soft, non-tender, gravid MS: Extremities nontender, no edema, normal ROM Neurologic: Alert and oriented x 4.  GU: no CVA tenderness Pelvic:  Deferred     Fetal Tracing: Cat 1 @1645  Baseline: 130 Variability: moderate Accelerations: present Decelerations: absent Toco: UI    Labs: Results for orders placed or performed during the hospital encounter of 01/28/24 (from the past 24 hours)  Glucose, capillary     Status: Abnormal   Collection Time: 01/28/24  2:36 PM  Result Value Ref Range   Glucose-Capillary 112 (H) 70 - 99 mg/dL   Comment 1 Notify RN   Urinalysis, Routine w reflex microscopic -Urine, Random     Status: Abnormal   Collection Time: 01/28/24  2:44 PM  Result Value Ref Range   Color, Urine YELLOW YELLOW   APPearance HAZY (A) CLEAR   Specific Gravity, Urine 1.008 1.005 - 1.030   pH 6.0 5.0 - 8.0   Glucose, UA NEGATIVE NEGATIVE mg/dL   Hgb urine dipstick SMALL (A) NEGATIVE   Bilirubin Urine NEGATIVE NEGATIVE   Ketones, ur NEGATIVE NEGATIVE mg/dL   Protein, ur NEGATIVE NEGATIVE mg/dL   Nitrite NEGATIVE NEGATIVE   Leukocytes,Ua SMALL (A) NEGATIVE   RBC / HPF 0-5 0 - 5 RBC/hpf   WBC, UA 6-10 0 - 5 WBC/hpf   Bacteria, UA FEW (A) NONE SEEN   Squamous Epithelial / HPF 11-20 0 - 5 /HPF   Mucus PRESENT   CBC     Status: Abnormal   Collection Time: 01/28/24  2:51 PM  Result Value Ref Range   WBC 8.5 4.0 - 10.5 K/uL   RBC 3.47 (L) 3.87 - 5.11 MIL/uL   Hemoglobin 10.0 (L) 12.0 - 15.0 g/dL   HCT 70.2 (L) 63.9 - 53.9 %   MCV 85.6 80.0 - 100.0 fL   MCH 28.8 26.0 - 34.0 pg   MCHC 33.7 30.0 - 36.0 g/dL   RDW 86.7 88.4 - 84.4 %   Platelets 272 150 - 400 K/uL   nRBC 0.0 0.0 - 0.2 %  Comprehensive metabolic panel     Status: Abnormal   Collection Time: 01/28/24  2:51 PM  Result Value Ref Range  Sodium 137 135 - 145 mmol/L   Potassium 3.0 (L) 3.5 - 5.1 mmol/L   Chloride 104 98 - 111 mmol/L   CO2 20 (L) 22 - 32  mmol/L   Glucose, Bld 100 (H) 70 - 99 mg/dL   BUN <5 (L) 6 - 20 mg/dL   Creatinine, Ser 9.22 0.44 - 1.00 mg/dL   Calcium  8.6 (L) 8.9 - 10.3 mg/dL   Total Protein 6.1 (L) 6.5 - 8.1 g/dL   Albumin 2.5 (L) 3.5 - 5.0 g/dL   AST 25 15 - 41 U/L   ALT 13 0 - 44 U/L   Alkaline Phosphatase 115 38 - 126 U/L   Total Bilirubin 0.5 0.0 - 1.2 mg/dL   GFR, Estimated >39 >39 mL/min   Anion gap 13 5 - 15  Brain natriuretic peptide     Status: None   Collection Time: 01/28/24  2:51 PM  Result Value Ref Range   B Natriuretic Peptide 28.0 0.0 - 100.0 pg/mL    Imaging:  No results found.  MDM & MAU COURSE  MDM:  HIGH  Near syncope in pregnancy at 36 weeks and 1 day  Prenatal chart reviewed Physical exam performed CBC: Anemic @ 10.0 ( Will plan for RX at discharge) CMP c/w hypokalemia at 3.0 ( Will PO Supplement per patient request , declines [place]) BNP: NM UA: unremarkable CBG: 112  EKG: Sinus tachycardia with non specific T wave abnormality ( EKG reviewed by Dr Cindie in Cardiology- unremarkable findings) LR bolus/ Banana Bag ordered  NST for gestational age and fetal reassurance  MAU Course: Orders Placed This Encounter  Procedures   Glucose, capillary   CBC   Comprehensive metabolic panel   Urinalysis, Routine w reflex microscopic -Urine, Random   Brain natriuretic peptide   EKG 12-Lead   Discharge patient Discharge disposition: 01-Home or Self Care; Discharge patient date: 01/28/2024   Meds ordered this encounter  Medications   lactated ringers  bolus 1,000 mL   M.V.I. Adult (INFUVITE ADULT ) 10 mL in dextrose  5% lactated ringers  1,000 mL infusion   potassium chloride  SA (KLOR-CON  M) CR tablet 40 mEq   Ferric Maltol  30 MG CAPS    Sig: Take 1 capsule (30 mg total) by mouth 2 (two) times daily. Please take one hour before breakfast and dinner    Dispense:  60 capsule    Refill:  2    Supervising Provider:   PRATT, TANYA S [[id number]]   potassium chloride  SA (KLOR-CON  M) 20 MEQ  tablet    Sig: Take 1 tablet (20 mEq total) by mouth 2 (two) times daily for 3 days.    Dispense:  6 tablet    Refill:  0    Supervising Provider:   PRATT, TANYA S [[id number]]     I have reviewed the patient chart and performed the physical exam . I have ordered & interpreted the lab results and reviewed and interpreted the NST Medications ordered as stated below.  A/P as described below.  Counseling and education provided and patient agreeable  with plan as described below. Verbalized understanding.    ASSESSMENT   1. Near syncope   2. Hypokalemia   3. Anemia affecting pregnancy, antepartum   4. [redacted] weeks gestation of pregnancy     PLAN  Discharge home in stable condition with return precautions.   Follow up with primary OB as scheduled  See AVS for full description of information given to the patient including both verbal and written.  Patient verbalized understanding and agrees with the plan as described above.      Allergies as of 01/28/2024   No Known Allergies      Medication List     TAKE these medications    cyclobenzaprine  10 MG tablet Commonly known as: FLEXERIL  Take 1 tablet (10 mg total) by mouth 3 (three) times daily as needed for muscle spasms.   Ferric Maltol  30 MG Caps Take 1 capsule (30 mg total) by mouth 2 (two) times daily. Please take one hour before breakfast and dinner   multivitamin Liqd Take 5 mLs by mouth daily.   potassium chloride  SA 20 MEQ tablet Commonly known as: KLOR-CON  M Take 1 tablet (20 mEq total) by mouth 2 (two) times daily for 3 days.   Prenatal Vitamins 28-0.8 MG Tabs Take 1 tablet by mouth daily.        Olam Dalton, MSN, [facility], Center for [employer]

## 2024-01-28 NOTE — Discharge Instructions (Signed)
 Follow up with your Ob Provider
